# Patient Record
Sex: Male | Born: 1985 | Race: White | Hispanic: No | Smoking: Current some day smoker
Health system: Southern US, Community
[De-identification: ages and names within clinical notes are randomized; demographics above are authoritative.]

## PROBLEM LIST (undated history)

## (undated) DIAGNOSIS — S069XAA Unspecified intracranial injury with loss of consciousness status unknown, initial encounter: Secondary | ICD-10-CM

## (undated) DIAGNOSIS — S069X9A Unspecified intracranial injury with loss of consciousness of unspecified duration, initial encounter: Secondary | ICD-10-CM

## (undated) DIAGNOSIS — S3992XA Unspecified injury of lower back, initial encounter: Secondary | ICD-10-CM

---
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
## 2008-06-28 ENCOUNTER — Emergency Department (HOSPITAL_COMMUNITY): Admission: EM | Admit: 2008-06-28 | Discharge: 2008-06-28 | Payer: Self-pay | Admitting: Emergency Medicine

## 2009-08-22 ENCOUNTER — Emergency Department (HOSPITAL_COMMUNITY): Admission: EM | Admit: 2009-08-22 | Discharge: 2009-08-22 | Payer: Self-pay | Admitting: Emergency Medicine

## 2009-09-08 ENCOUNTER — Emergency Department (HOSPITAL_COMMUNITY): Admission: EM | Admit: 2009-09-08 | Discharge: 2009-09-09 | Payer: Self-pay | Admitting: Emergency Medicine

## 2010-12-21 ENCOUNTER — Emergency Department (HOSPITAL_COMMUNITY)
Admission: EM | Admit: 2010-12-21 | Discharge: 2010-12-21 | Disposition: A | Discharge status: Home / Self Care | Payer: Self-pay | Attending: Emergency Medicine | Admitting: Emergency Medicine

## 2010-12-21 DIAGNOSIS — R51 Headache: Secondary | ICD-10-CM | POA: Insufficient documentation

## 2011-02-07 ENCOUNTER — Emergency Department (HOSPITAL_COMMUNITY)
Admission: EM | Admit: 2011-02-07 | Discharge: 2011-02-07 | Disposition: A | Discharge status: Home / Self Care | Payer: Self-pay | Attending: Emergency Medicine | Admitting: Emergency Medicine

## 2011-02-07 DIAGNOSIS — Z87820 Personal history of traumatic brain injury: Secondary | ICD-10-CM | POA: Insufficient documentation

## 2011-02-07 DIAGNOSIS — M5430 Sciatica, unspecified side: Secondary | ICD-10-CM | POA: Insufficient documentation

## 2011-03-20 ENCOUNTER — Emergency Department (HOSPITAL_COMMUNITY)
Admission: EM | Admit: 2011-03-20 | Discharge: 2011-03-20 | Disposition: A | Discharge status: Home / Self Care | Payer: Self-pay | Attending: Emergency Medicine | Admitting: Emergency Medicine

## 2011-03-20 DIAGNOSIS — M545 Low back pain, unspecified: Secondary | ICD-10-CM | POA: Insufficient documentation

## 2011-08-12 ENCOUNTER — Inpatient Hospital Stay (INDEPENDENT_AMBULATORY_CARE_PROVIDER_SITE_OTHER)
Admission: RE | Admit: 2011-08-12 | Discharge: 2011-08-12 | Disposition: A | Discharge status: Home / Self Care | Payer: Self-pay | Source: Ambulatory Visit | Attending: Emergency Medicine | Admitting: Emergency Medicine

## 2011-08-12 DIAGNOSIS — S335XXA Sprain of ligaments of lumbar spine, initial encounter: Secondary | ICD-10-CM

## 2011-11-04 ENCOUNTER — Emergency Department (HOSPITAL_COMMUNITY)
Admission: EM | Admit: 2011-11-04 | Discharge: 2011-11-04 | Disposition: A | Discharge status: Home / Self Care | Payer: Self-pay | Source: Home / Self Care | Attending: Family Medicine | Admitting: Family Medicine

## 2011-11-04 DIAGNOSIS — M62838 Other muscle spasm: Secondary | ICD-10-CM

## 2011-11-04 DIAGNOSIS — M545 Low back pain: Secondary | ICD-10-CM

## 2011-11-04 HISTORY — DX: Unspecified injury of lower back, initial encounter: S39.92XA

## 2011-11-04 MED ORDER — TRAMADOL HCL 50 MG PO TABS: 50.0000 mg | ORAL_TABLET | Freq: Three times a day (TID) | ORAL | Status: AC | PRN

## 2011-11-04 MED ORDER — CYCLOBENZAPRINE HCL 10 MG PO TABS: 10.0000 mg | ORAL_TABLET | Freq: Two times a day (BID) | ORAL | Status: AC | PRN

## 2011-11-04 MED ORDER — IBUPROFEN 800 MG PO TABS: 800.0000 mg | ORAL_TABLET | Freq: Three times a day (TID) | ORAL | Status: AC
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
## 2014-03-08 ENCOUNTER — Emergency Department (HOSPITAL_COMMUNITY)
Admission: EM | Admit: 2014-03-08 | Discharge: 2014-03-08 | Disposition: A | Discharge status: Home / Self Care | Payer: Non-veteran care | Attending: Emergency Medicine | Admitting: Emergency Medicine

## 2014-03-08 ENCOUNTER — Encounter (HOSPITAL_COMMUNITY): Payer: Self-pay | Admitting: Emergency Medicine

## 2014-03-08 DIAGNOSIS — M545 Low back pain, unspecified: Secondary | ICD-10-CM | POA: Insufficient documentation

## 2014-03-08 DIAGNOSIS — Z87828 Personal history of other (healed) physical injury and trauma: Secondary | ICD-10-CM | POA: Insufficient documentation

## 2014-03-08 DIAGNOSIS — Z87820 Personal history of traumatic brain injury: Secondary | ICD-10-CM | POA: Insufficient documentation

## 2014-03-08 DIAGNOSIS — R143 Flatulence: Secondary | ICD-10-CM

## 2014-03-08 DIAGNOSIS — F17200 Nicotine dependence, unspecified, uncomplicated: Secondary | ICD-10-CM | POA: Insufficient documentation

## 2014-03-08 DIAGNOSIS — R209 Unspecified disturbances of skin sensation: Secondary | ICD-10-CM | POA: Insufficient documentation

## 2014-03-08 DIAGNOSIS — R141 Gas pain: Secondary | ICD-10-CM | POA: Insufficient documentation

## 2014-03-08 DIAGNOSIS — R142 Eructation: Secondary | ICD-10-CM

## 2014-03-08 HISTORY — DX: Unspecified intracranial injury with loss of consciousness status unknown, initial encounter: S06.9XAA

## 2014-03-08 HISTORY — DX: Unspecified intracranial injury with loss of consciousness of unspecified duration, initial encounter: S06.9X9A

## 2014-03-08 MED ORDER — OXYCODONE-ACETAMINOPHEN 5-325 MG PO TABS: 2.0000 | ORAL_TABLET | ORAL | Status: DC | PRN

## 2014-03-08 MED ORDER — IBUPROFEN 800 MG PO TABS: 800.0000 mg | ORAL_TABLET | Freq: Three times a day (TID) | ORAL | Status: DC

## 2014-03-08 MED ORDER — CYCLOBENZAPRINE HCL 10 MG PO TABS: 10.0000 mg | ORAL_TABLET | Freq: Two times a day (BID) | ORAL | Status: DC | PRN

## 2014-03-08 MED ORDER — HYDROMORPHONE HCL PF 1 MG/ML IJ SOLN
1.0000 mg | Freq: Once | INTRAMUSCULAR | Status: AC
  Administered 2014-03-08: 1 mg via INTRAMUSCULAR
  Filled 2014-03-08: qty 1

## 2014-03-08 NOTE — ED Provider Notes (Signed)
CSN: [id number]     Arrival date & time 03/08/14  0547 History   First MD Initiated Contact with Patient 03/08/14 0600     Chief Complaint  Patient presents with  . Back Pain     (Consider location/radiation/quality/duration/timing/severity/associated sxs/prior Treatment) HPI HX per PT - Has intermittent LBP for the last few years followed by PCP at he [facility].  He went to work last night with LBP that was MOD to severe and worse this am. Pain radiates to both legs, had some numbness in his toes but not right now. No weakness. He has had these symptoms in the past.  He had MRI 2 weeks ago but has not got the results yet. He scheduled for follow up. No incontinence. No F/C. 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 No new symptoms. Had motrin PTA Past Medical History  Diagnosis Date  . Back injury   . Traumatic brain injury    History reviewed. No pertinent past surgical history. History reviewed. No pertinent family history. History  Substance Use Topics  . Smoking status: Current Every Day Smoker -- 0.25 packs/day  . Smokeless tobacco: Not on file  . Alcohol Use: No    Review of Systems  Constitutional: Negative for fever and chills.  Respiratory: Negative for shortness of breath.   Cardiovascular: Negative for chest pain.  Gastrointestinal: Negative for vomiting and abdominal pain.  Genitourinary: Negative for dysuria.  Musculoskeletal: Positive for back pain. Negative for neck pain.  Skin: Negative for rash.  Neurological: Negative for headaches.  All other systems reviewed and are negative.     Allergies  Review of patient's allergies indicates no known allergies.  Home Medications   Prior to Admission medications   Not on File   BP 147/100  Pulse 115  Temp(Src) 98.2 F (36.8 C) (Oral)  Resp 16  Ht 5\' 10"  (1.778 m)  Wt 187 lb (84.823 kg)  BMI 26.83 kg/m2  SpO2 100% Physical Exam  Constitutional: He is oriented to  person, place, and time. He appears well-developed and well-nourished.  HENT:  Head: Normocephalic and atraumatic.  Eyes: EOM are normal. Pupils are equal, round, and reactive to light.  Neck: Neck supple.  Cardiovascular: Regular rhythm and intact distal pulses.   Pulmonary/Chest: Effort normal. No respiratory distress.  Abdominal: Soft. Bowel sounds are normal. He exhibits distension.  Musculoskeletal: Normal range of motion. He exhibits no edema.  TTP over lower lumbar spine and paralumbar spine. No midline deformity, no thoracic tenderness. Gait intact. Equal DTRs, distal motor and sensorium to light touch intact/ equal.   Neurological: He is alert and oriented to person, place, and time.  Skin: Skin is warm and dry.    ED Course  Procedures (including critical care time) Labs Review Labs Reviewed - No data to display  Imaging Review No results found.  IM Dilaudid  Pain improved. Plan d/c home to f/u PCP as scheduled for recheck and f/u MRI results. Back pain precautions provided.   MDM   Dx: LBP  IM Narcotics and ice for pain No indication for repeat MRI today VS and nurses notes reviewed/ considered   Sunnie NielsenBrian Kerina Simoneau, MD 03/08/14 0630

## 2014-03-08 NOTE — Discharge Instructions (Signed)
Back Pain, Adult Low back pain is very common. About 1 in 5 people have back pain.The cause of low back pain is rarely dangerous. The pain often gets better over time.About half of people with a sudden onset of back pain feel better in just 2 weeks. About 8 in 10 people feel better by 6 weeks.  CAUSES Some common causes of back pain include:  Strain of the muscles or ligaments supporting the spine.  Wear and tear (degeneration) of the spinal discs.  Arthritis.  Direct injury to the back. DIAGNOSIS Most of the time, the direct cause of low back pain is not known.However, back pain can be treated effectively even when the exact cause of the pain is unknown.Answering your caregiver's questions about your overall health and symptoms is one of the most accurate ways to make sure the cause of your pain is not dangerous. If your caregiver needs more information, he or she may order lab work or imaging tests (X-rays or MRIs).However, even if imaging tests show changes in your back, this usually does not require surgery. HOME CARE INSTRUCTIONS For many people, back pain returns.Since low back pain is rarely dangerous, it is often a condition that people can learn to manageon their own.   Remain active. It is stressful on the back to sit or stand in one place. Do not sit, drive, or stand in one place for more than 30 minutes at a time. Take short walks on level surfaces as soon as pain allows.Try to increase the length of time you walk each day.  Do not stay in bed.Resting more than 1 or 2 days can delay your recovery.  Do not avoid exercise or work.Your body is made to move.It is not dangerous to be active, even though your back may hurt.Your back will likely heal faster if you return to being active before your pain is gone.  Pay attention to your body when you bend and lift. Many people have less discomfortwhen lifting if they bend their knees, keep the load close to their bodies,and  avoid twisting. Often, the most comfortable positions are those that put less stress on your recovering back.  Find a comfortable position to sleep. Use a firm mattress and lie on your side with your knees slightly bent. If you lie on your back, put a pillow under your knees.  Only take over-the-counter or prescription medicines as directed by your caregiver. Over-the-counter medicines to reduce pain and inflammation are often the most helpful.Your caregiver may prescribe muscle relaxant drugs.These medicines help dull your pain so you can more quickly return to your normal activities and healthy exercise.  Put ice on the injured area.  Put ice in a plastic bag.  Place a towel between your skin and the bag.  Leave the ice on for 15-20 minutes, 03-04 times a day for the first 2 to 3 days. After that, ice and heat may be alternated to reduce pain and spasms.  Ask your caregiver about trying back exercises and gentle massage. This may be of some benefit.  Avoid feeling anxious or stressed.Stress increases muscle tension and can worsen back pain.It is important to recognize when you are anxious or stressed and learn ways to manage it.Exercise is a great option. SEEK MEDICAL CARE IF:  You have pain that is not relieved with rest or medicine.  You have pain that does not improve in 1 week.  You have new symptoms.  You are generally not feeling well. SEEK   IMMEDIATE MEDICAL CARE IF:   You have pain that radiates from your back into your legs.  You develop new bowel or bladder control problems.  You have unusual weakness or numbness in your arms or legs.  You develop nausea or vomiting.  You develop abdominal pain.  You feel faint. Document Released: 10/17/2005 Document Revised: 04/17/2012 Document Reviewed: 03/07/2011 ExitCare Patient Information 2014 [employer].  

## 2014-03-08 NOTE — ED Notes (Signed)
History of back injury in 2010 but has not had flare up until tonight at work.  Works as Optometristbouncer.  Pain is mid back mostly on R w/radiation down back of legs bilaterally.

## 2014-07-07 ENCOUNTER — Encounter (HOSPITAL_COMMUNITY): Payer: Self-pay | Admitting: Emergency Medicine

## 2014-07-07 ENCOUNTER — Emergency Department (HOSPITAL_COMMUNITY)
Admission: EM | Admit: 2014-07-07 | Discharge: 2014-07-07 | Disposition: A | Discharge status: Home / Self Care | Payer: Non-veteran care | Attending: Emergency Medicine | Admitting: Emergency Medicine

## 2014-07-07 DIAGNOSIS — Z87820 Personal history of traumatic brain injury: Secondary | ICD-10-CM | POA: Diagnosis not present

## 2014-07-07 DIAGNOSIS — Z87828 Personal history of other (healed) physical injury and trauma: Secondary | ICD-10-CM | POA: Insufficient documentation

## 2014-07-07 DIAGNOSIS — F17200 Nicotine dependence, unspecified, uncomplicated: Secondary | ICD-10-CM | POA: Insufficient documentation

## 2014-07-07 DIAGNOSIS — M545 Low back pain, unspecified: Secondary | ICD-10-CM | POA: Insufficient documentation

## 2014-07-07 DIAGNOSIS — M5441 Lumbago with sciatica, right side: Secondary | ICD-10-CM

## 2014-07-07 DIAGNOSIS — Z791 Long term (current) use of non-steroidal anti-inflammatories (NSAID): Secondary | ICD-10-CM | POA: Insufficient documentation

## 2014-07-07 DIAGNOSIS — M5430 Sciatica, unspecified side: Secondary | ICD-10-CM | POA: Diagnosis not present

## 2014-07-07 MED ORDER — ACETAMINOPHEN-CODEINE #3 300-30 MG PO TABS: 1.0000 | ORAL_TABLET | Freq: Four times a day (QID) | ORAL | Status: DC | PRN

## 2014-07-07 MED ORDER — METHOCARBAMOL 500 MG PO TABS
1000.0000 mg | ORAL_TABLET | Freq: Once | ORAL | Status: AC
Start: 2014-07-07 — End: 2014-07-07
  Administered 2014-07-07: 1000 mg via ORAL
  Filled 2014-07-07: qty 2

## 2014-07-07 MED ORDER — IBUPROFEN 800 MG PO TABS
800.0000 mg | ORAL_TABLET | Freq: Once | ORAL | Status: AC
  Administered 2014-07-07: 800 mg via ORAL
  Filled 2014-07-07: qty 1

## 2014-07-07 MED ORDER — ACETAMINOPHEN-CODEINE #3 300-30 MG PO TABS
2.0000 | ORAL_TABLET | Freq: Once | ORAL | Status: AC
  Administered 2014-07-07: 2 via ORAL
  Filled 2014-07-07: qty 2
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
## 2014-07-07 NOTE — Discharge Instructions (Signed)
Back Pain, Adult °Back pain is very common. The pain often gets better over time. The cause of back pain is usually not dangerous. Most people can learn to manage their back pain on their own.  °HOME CARE  °· Stay active. Start with short walks on flat ground if you can. Try to walk farther each day. °· Do not sit, drive, or stand in one place for more than 30 minutes. Do not stay in bed. °· Do not avoid exercise or work. Activity can help your back heal faster. °· Be careful when you bend or lift an object. Bend at your knees, keep the object close to you, and do not twist. °· Sleep on a firm mattress. Lie on your side, and bend your knees. If you lie on your back, put a pillow under your knees. °· Only take medicines as told by your doctor. °· Put ice on the injured area. °¨ Put ice in a plastic bag. °¨ Place a towel between your skin and the bag. °¨ Leave the ice on for 15-20 minutes, 03-04 times a day for the first 2 to 3 days. After that, you can switch between ice and heat packs. °· Ask your doctor about back exercises or massage. °· Avoid feeling anxious or stressed. Find good ways to deal with stress, such as exercise. °GET HELP RIGHT AWAY IF:  °· Your pain does not go away with rest or medicine. °· Your pain does not go away in 1 week. °· You have new problems. °· You do not feel well. °· The pain spreads into your legs. °· You cannot control when you poop (bowel movement) or pee (urinate). °· Your arms or legs feel weak or lose feeling (numbness). °· You feel sick to your stomach (nauseous) or throw up (vomit). °· You have belly (abdominal) pain. °· You feel like you may pass out (faint). °MAKE SURE YOU:  °· Understand these instructions. °· Will watch your condition. °· Will get help right away if you are not doing well or get worse. °Document Released: 04/04/2008 Document Revised: 01/09/2012 Document Reviewed: 02/18/2014 °ExitCare® Patient Information ©2015 [employer]. This information is not intended  to replace advice given to you by your health care provider. Make sure you discuss any questions you have with your health care provider. ° °
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
## 2014-07-07 NOTE — ED Provider Notes (Signed)
Medical screening examination/treatment/procedure(s) were performed by non-physician practitioner and as supervising physician I was immediately available for consultation/collaboration.    Waleska Buttery, MD 07/07/14 2239 
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
## 2014-08-26 ENCOUNTER — Emergency Department (HOSPITAL_COMMUNITY)
Admission: EM | Admit: 2014-08-26 | Discharge: 2014-08-26 | Disposition: A | Discharge status: Home / Self Care | Payer: Non-veteran care | Attending: Emergency Medicine | Admitting: Emergency Medicine

## 2014-08-26 ENCOUNTER — Encounter (HOSPITAL_COMMUNITY): Payer: Self-pay | Admitting: Emergency Medicine

## 2014-08-26 ENCOUNTER — Emergency Department (HOSPITAL_COMMUNITY): Payer: Non-veteran care

## 2014-08-26 DIAGNOSIS — R200 Anesthesia of skin: Secondary | ICD-10-CM | POA: Diagnosis not present

## 2014-08-26 DIAGNOSIS — Z720 Tobacco use: Secondary | ICD-10-CM | POA: Diagnosis not present

## 2014-08-26 DIAGNOSIS — R61 Generalized hyperhidrosis: Secondary | ICD-10-CM | POA: Diagnosis not present

## 2014-08-26 DIAGNOSIS — R0602 Shortness of breath: Secondary | ICD-10-CM | POA: Diagnosis not present

## 2014-08-26 DIAGNOSIS — R079 Chest pain, unspecified: Secondary | ICD-10-CM | POA: Diagnosis not present

## 2014-08-26 DIAGNOSIS — Z7982 Long term (current) use of aspirin: Secondary | ICD-10-CM | POA: Diagnosis not present

## 2014-08-26 DIAGNOSIS — Z87828 Personal history of other (healed) physical injury and trauma: Secondary | ICD-10-CM | POA: Diagnosis not present

## 2014-08-26 DIAGNOSIS — Z87820 Personal history of traumatic brain injury: Secondary | ICD-10-CM | POA: Insufficient documentation

## 2014-08-26 LAB — COMPREHENSIVE METABOLIC PANEL
ALT: 14 U/L (ref 0–53)
AST: 18 U/L (ref 0–37)
Albumin: 4.3 g/dL (ref 3.5–5.2)
Alkaline Phosphatase: 67 U/L (ref 39–117)
Anion gap: 9 (ref 5–15)
BUN: 12 mg/dL (ref 6–23)
CO2: 31 mEq/L (ref 19–32)
Calcium: 9.4 mg/dL (ref 8.4–10.5)
Chloride: 99 mEq/L (ref 96–112)
Creatinine, Ser: 0.94 mg/dL (ref 0.50–1.35)
GFR calc Af Amer: >90 mL/min (ref >90)
GFR calc non Af Amer: >90 mL/min (ref >90)
Glucose, Bld: 97 mg/dL (ref 70–99)
Potassium: 4.5 mEq/L (ref 3.7–5.3)
Sodium: 139 mEq/L (ref 137–147)
Total Bilirubin: 0.2 mg/dL — ABNORMAL LOW (ref 0.3–1.2)
Total Protein: 7.7 g/dL (ref 6.0–8.3)

## 2014-08-26 LAB — CBC WITH DIFFERENTIAL/PLATELET
Basophils Absolute: 0 10*3/uL (ref 0.0–0.1)
Basophils Relative: 0 % (ref 0–1)
Eosinophils Absolute: 0.4 10*3/uL (ref 0.0–0.7)
Eosinophils Relative: 3 % (ref 0–5)
HCT: 46.4 % (ref 39.0–52.0)
Hemoglobin: 15.9 g/dL (ref 13.0–17.0)
Lymphocytes Relative: 25 % (ref 12–46)
Lymphs Abs: 3 10*3/uL (ref 0.7–4.0)
MCH: 30.8 pg (ref 26.0–34.0)
MCHC: 34.3 g/dL (ref 30.0–36.0)
MCV: 89.7 fL (ref 78.0–100.0)
Monocytes Absolute: 0.5 10*3/uL (ref 0.1–1.0)
Monocytes Relative: 4 % (ref 3–12)
Neutro Abs: 8.3 10*3/uL — ABNORMAL HIGH (ref 1.7–7.7)
Neutrophils Relative %: 68 % (ref 43–77)
Platelets: 180 10*3/uL (ref 150–400)
RBC: 5.17 MIL/uL (ref 4.22–5.81)
RDW: 12.8 % (ref 11.5–15.5)
WBC: 12.2 10*3/uL — ABNORMAL HIGH (ref 4.0–10.5)

## 2014-08-26 LAB — TROPONIN I: Troponin I: <0.3 ng/mL (ref <0.30)

## 2014-08-26 NOTE — ED Notes (Signed)
Patient c/o left side chest pain that radiates down left arm. Per patient pain started Thursday but has progressively gotten worse today. Patient reports shortness of breath and "sweating." Patient reports taking 81 mg aspirin today.

## 2014-08-26 NOTE — Discharge Instructions (Signed)

## 2014-08-26 NOTE — ED Provider Notes (Signed)
CSN: [id number]     Arrival date & time 08/26/14  1507 History  This chart was scribed for Roger RazorStephen Primus Gritton, MD by Bronson CurbJacqueline Melvin, ED Scribe. This patient was seen in room [facility] and the patient's care was started at 8:47 PM.    Chief Complaint  Patient presents with  . Chest Pain    The history is provided by the patient and a parent. No language interpreter was used.    HPI Comments: Roger CrewsJason A Cox is a 28 y.o. male who presents to the Emergency Department complaining of sudden onset left sided chest pain that began 10 hours ago. Patient states he was asymptomatic when he woke up this morning. He denies any physical exertion prior to onset and states he was lying the couch when the pain began. There is associated chest tightness, SOB, diaphoresis, and numbness/tinging to the left arm. He denies history of prior episodes. Patient reports relief while at rest, but denies any aggravating factors. He denies any leg pain or swelling. Patient states his symptoms have since resolved, and feels he has returned to baseline, but notes some lingering back pain. He is a current occasional smoker, with a family history of MI on the paternal side. Mother is concerned for the patient's elevated blood pressure (155/111 at 1935), and reports that patient takes a variety of medications, including Flexril and Ambien, and consumes energy drinks in large quantities. Patient receives his primary care through the [facility], and will follow up there regarding his high blood pressure.    Past Medical History  Diagnosis Date  . Back injury   . Traumatic brain injury    History reviewed. No pertinent past surgical history. Family History  Problem Relation Age of Onset  . Heart attack Father   . Congenital heart disease Sister    History  Substance Use Topics  . Smoking status: Current Every Day Smoker -- 0.25 packs/day for 10 years    Types: Cigarettes  . Smokeless tobacco: Never Used  . Alcohol Use: No     Review of Systems  Constitutional: Positive for diaphoresis.  Respiratory: Positive for chest tightness and shortness of breath.   Cardiovascular: Positive for chest pain. Negative for leg swelling.  Neurological: Positive for numbness.  All other systems reviewed and are negative.     Allergies  Review of patient's allergies indicates no known allergies.  Home Medications   Prior to Admission medications   Medication Sig Start Date End Date Taking? Authorizing Provider  aspirin EC 81 MG tablet Take 324 mg by mouth once as needed (for chest pain).   Yes Historical Provider, MD  cyclobenzaprine (FLEXERIL) 10 MG tablet Take 10 mg by mouth 3 (three) times daily as needed for muscle spasms.   Yes Historical Provider, MD  zolpidem (AMBIEN) 5 MG tablet Take 5 mg by mouth at bedtime as needed for sleep.   Yes Historical Provider, MD   Triage Vitals: BP 155/111  Pulse 78  Temp(Src) 99.1 F (37.3 C) (Oral)  Resp 14  Ht 5\' 11"  (1.803 m)  Wt 185 lb (83.915 kg)  BMI 25.81 kg/m2  SpO2 98%  Physical Exam  Nursing note and vitals reviewed. Constitutional: He is oriented to person, place, and time. He appears well-developed and well-nourished. No distress.  HENT:  Head: Normocephalic and atraumatic.  Eyes: Conjunctivae and EOM are normal.  Neck: Neck supple. No tracheal deviation present.  Cardiovascular: Normal rate, regular rhythm and normal heart sounds.   Pulmonary/Chest: Effort normal and  breath sounds normal. No respiratory distress.  Musculoskeletal: Normal range of motion.  Neurological: He is alert and oriented to person, place, and time.  Skin: Skin is warm and dry.  Psychiatric: He has a normal mood and affect. His behavior is normal.    ED Course  Procedures (including critical care time)  DIAGNOSTIC STUDIES: Oxygen Saturation is 98% on room air, normal by my interpretation.    COORDINATION OF CARE: At 2054 Discussed treatment plan with patient which includes  review of EKG and lab work. Patient agrees.   Labs Review Labs Reviewed  CBC WITH DIFFERENTIAL - Abnormal; Notable for the following:    WBC 12.2 (*)    Neutro Abs 8.3 (*)    All other components within normal limits  COMPREHENSIVE METABOLIC PANEL - Abnormal; Notable for the following:    Total Bilirubin 0.2 (*)    All other components within normal limits  TROPONIN I    Imaging Review Dg Chest 2 View  08/26/2014   CLINICAL DATA:  Chest pain.  EXAM: CHEST  2 VIEW  COMPARISON:  None.  FINDINGS: The heart size and mediastinal contours are within normal limits. Both lungs are clear. No pneumothorax or pleural effusion is noted. The visualized skeletal structures are unremarkable.  IMPRESSION: No acute cardiopulmonary abnormality seen.   Electronically Signed   By: Roque LiasJames  Cox M.D.   On: 08/26/2014 16:10     EKG Interpretation   Date/Time:  Tuesday August 26 2014 15:24:08 EDT Ventricular Rate:  91 PR Interval:  122 QRS Duration: 92 QT Interval:  364 QTC Calculation: 447 R Axis:   27 Text Interpretation:  Normal sinus rhythm Normal ECG Confirmed by Roger Demuro   MD, Roger Cox ([id number]) on 08/26/2014 8:05:10 PM      MDM   Final diagnoses:  Chest pain, unspecified chest pain type    28 year old male with chest pain. Atypical for ACS. Doubt pulmonary embolism. Dissection. Chest x-ray is clear. EKG without acute appearing changes. Very low suspicion for emergent process. Return precautions were discussed. Discussed elevated blood pressure in the emergency room. He has no diagnosed history of this. He needs follow-up this PCP.   I personally preformed the services scribed in my presence. The recorded information has been reviewed is accurate. Roger RazorStephen Alexiana Laverdure, MD.    Roger RazorStephen Roger Boeh, MD 09/04/14 1050

## 2015-05-08 ENCOUNTER — Emergency Department (HOSPITAL_COMMUNITY): Payer: Non-veteran care

## 2015-05-08 ENCOUNTER — Encounter (HOSPITAL_COMMUNITY): Payer: Self-pay | Admitting: *Deleted

## 2015-05-08 ENCOUNTER — Emergency Department (HOSPITAL_COMMUNITY)
Admission: EM | Admit: 2015-05-08 | Discharge: 2015-05-08 | Disposition: A | Discharge status: Home / Self Care | Payer: Non-veteran care | Attending: Emergency Medicine | Admitting: Emergency Medicine

## 2015-05-08 DIAGNOSIS — S0990XA Unspecified injury of head, initial encounter: Secondary | ICD-10-CM | POA: Diagnosis present

## 2015-05-08 DIAGNOSIS — Z720 Tobacco use: Secondary | ICD-10-CM | POA: Insufficient documentation

## 2015-05-08 DIAGNOSIS — Z7982 Long term (current) use of aspirin: Secondary | ICD-10-CM | POA: Insufficient documentation

## 2015-05-08 DIAGNOSIS — W01198A Fall on same level from slipping, tripping and stumbling with subsequent striking against other object, initial encounter: Secondary | ICD-10-CM | POA: Insufficient documentation

## 2015-05-08 DIAGNOSIS — S39012A Strain of muscle, fascia and tendon of lower back, initial encounter: Secondary | ICD-10-CM | POA: Diagnosis not present

## 2015-05-08 DIAGNOSIS — Z87820 Personal history of traumatic brain injury: Secondary | ICD-10-CM | POA: Diagnosis not present

## 2015-05-08 DIAGNOSIS — S29012A Strain of muscle and tendon of back wall of thorax, initial encounter: Secondary | ICD-10-CM | POA: Insufficient documentation

## 2015-05-08 DIAGNOSIS — Z87828 Personal history of other (healed) physical injury and trauma: Secondary | ICD-10-CM | POA: Diagnosis not present

## 2015-05-08 DIAGNOSIS — S161XXA Strain of muscle, fascia and tendon at neck level, initial encounter: Secondary | ICD-10-CM | POA: Insufficient documentation

## 2015-05-08 DIAGNOSIS — R52 Pain, unspecified: Secondary | ICD-10-CM

## 2015-05-08 DIAGNOSIS — Y92008 Other place in unspecified non-institutional (private) residence as the place of occurrence of the external cause: Secondary | ICD-10-CM | POA: Diagnosis not present

## 2015-05-08 DIAGNOSIS — S060X1A Concussion with loss of consciousness of 30 minutes or less, initial encounter: Secondary | ICD-10-CM | POA: Diagnosis not present

## 2015-05-08 DIAGNOSIS — Y9389 Activity, other specified: Secondary | ICD-10-CM | POA: Diagnosis not present

## 2015-05-08 DIAGNOSIS — Y998 Other external cause status: Secondary | ICD-10-CM | POA: Insufficient documentation

## 2015-05-08 MED ORDER — FENTANYL CITRATE (PF) 100 MCG/2ML IJ SOLN
100.0000 ug | Freq: Once | INTRAMUSCULAR | Status: AC
  Administered 2015-05-08: 100 ug via INTRAVENOUS
  Filled 2015-05-08: qty 2

## 2015-05-08 MED ORDER — KETOROLAC TROMETHAMINE 30 MG/ML IJ SOLN
30.0000 mg | Freq: Once | INTRAMUSCULAR | Status: AC
  Administered 2015-05-08: 30 mg via INTRAVENOUS
  Filled 2015-05-08: qty 1

## 2015-05-08 MED ORDER — MORPHINE SULFATE 4 MG/ML IJ SOLN
4.0000 mg | Freq: Once | INTRAMUSCULAR | Status: AC
  Administered 2015-05-08: 4 mg via INTRAVENOUS
  Filled 2015-05-08: qty 1
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
## 2015-05-08 NOTE — ED Notes (Signed)
Pt able to ambulate around nursing desk with no problems noted,

## 2015-05-08 NOTE — ED Notes (Signed)
Dr Wickline at bedside,  

## 2015-05-08 NOTE — Discharge Instructions (Signed)
You have had a head injury which does not appear to require admission at this time. A concussion is a state of changed mental ability from trauma.  SEEK IMMEDIATE MEDICAL ATTENTION IF: There is confusion or drowsiness (although children frequently become drowsy after injury).  You cannot awaken the injured person.  There is nausea (feeling sick to your stomach) or continued, forceful vomiting.  You notice dizziness or unsteadiness which is getting worse, or inability to walk.  You have convulsions or unconsciousness.  You experience severe, persistent headaches not relieved by Tylenol. (Do not take aspirin as this impairs clotting abilities). Take other pain medications only as directed.  You cannot use arms or legs normally.  There are changes in pupil sizes. (This is the black center in the colored part of the eye)  There is clear or bloody discharge from the nose or ears.  Change in speech, vision, swallowing, or understanding.  Localized weakness, numbness, tingling, or change in bowel or bladder control.  You have neck pain, possibly from a cervical strain and/or pinched nerve.   SEEK IMMEDIATE MEDICAL ATTENTION IF: You develop difficulties swallowing or breathing.  You have new or worse numbness, weakness, tingling, or movement problems in your arms or legs.  You develop increasing pain which is uncontrolled with medications.  You have change in bowel or bladder function, or other concerns.   SEEK IMMEDIATE MEDICAL ATTENTION IF: New numbness, tingling, weakness, or problem with the use of your arms or legs.  Severe back pain not relieved with medications.  Change in bowel or bladder control (if you lose control of stool or urine, or if you are unable to urinate) Increasing pain in any areas of the body (such as chest or abdominal pain).  Shortness of breath, dizziness or fainting.  Nausea (feeling sick to your stomach), vomiting, fever, or sweats.

## 2015-05-08 NOTE — ED Notes (Signed)
Dr. Wickline at bedside.  

## 2015-05-08 NOTE — ED Notes (Signed)
c-collar removed, 

## 2015-05-08 NOTE — ED Notes (Signed)
Pt was on scoop stretcher which was removed by ems, cms intact pre and post removal of scoop stretcher,

## 2015-05-08 NOTE — ED Notes (Signed)
Pt arrived to er with c/o fall, ? LOC, ems reports that pt fell at least 6 feet down steps, landing on neck and back area, c/o pain to same areas, pt states that he had walked outside, slipped on wet steps, pt arrived to er with c-collar in place, alert, able to answer questions, cms intact all extremities,

## 2015-05-08 NOTE — ED Notes (Signed)
Pt requesting additional iv pain medication, reminded pt that he had just gotten iv pain medication, pt states that he did not realize that he had "that much medication in him", Dr Bebe ShaggyWickline notified,

## 2016-02-15 ENCOUNTER — Emergency Department (HOSPITAL_COMMUNITY)
Admission: EM | Admit: 2016-02-15 | Discharge: 2016-02-15 | Disposition: A | Discharge status: Home / Self Care | Payer: Non-veteran care | Attending: Emergency Medicine | Admitting: Emergency Medicine

## 2016-02-15 ENCOUNTER — Encounter (HOSPITAL_COMMUNITY): Payer: Self-pay

## 2016-02-15 DIAGNOSIS — M545 Low back pain, unspecified: Secondary | ICD-10-CM

## 2016-02-15 DIAGNOSIS — Z9889 Other specified postprocedural states: Secondary | ICD-10-CM | POA: Diagnosis not present

## 2016-02-15 DIAGNOSIS — G8929 Other chronic pain: Secondary | ICD-10-CM | POA: Insufficient documentation

## 2016-02-15 DIAGNOSIS — Z87820 Personal history of traumatic brain injury: Secondary | ICD-10-CM | POA: Diagnosis not present

## 2016-02-15 DIAGNOSIS — F17210 Nicotine dependence, cigarettes, uncomplicated: Secondary | ICD-10-CM | POA: Insufficient documentation

## 2016-02-15 DIAGNOSIS — Z7982 Long term (current) use of aspirin: Secondary | ICD-10-CM | POA: Insufficient documentation

## 2016-02-15 MED ORDER — DIAZEPAM 10 MG PO TABS: 10.0000 mg | ORAL_TABLET | Freq: Four times a day (QID) | ORAL | Status: DC | PRN

## 2016-02-15 MED ORDER — HYDROCODONE-ACETAMINOPHEN 5-325 MG PO TABS: 1.0000 | ORAL_TABLET | ORAL | Status: DC | PRN

## 2016-02-15 NOTE — ED Notes (Signed)
Dr Effie ShyWentz at bedside. Patient does not want steroids the girlfriend does and is requesting. Patient ready for discharge.

## 2016-02-15 NOTE — ED Notes (Addendum)
Pt c/o BLE weakness starting this afternoon and chronic R lower back pain.  Pain score 9/10.  Pt reports taking ibuprofen w/o relief.  Pt reports "[place].  About 30 minutes ago (1430) my legs just gave out and now, they are tingly."  Pt was bending over at a water fountain when his legs "gave out."  Further, Pt reports helping his fiancee move recently.

## 2016-02-15 NOTE — ED Notes (Signed)
Pt request something for pain prior to discharge; Effie ShyWentz made aware face to face.

## 2016-02-15 NOTE — Discharge Instructions (Signed)

## 2016-02-15 NOTE — ED Notes (Addendum)
Wentz at bedside. 

## 2016-02-15 NOTE — ED Notes (Addendum)
Patient family member demanding a steroid shot be administered. Dr Effie ShyWentz informed. Reports that he would be in to see patient. Patient states that he is not leaving until he speaks with someone.
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
## 2016-02-15 NOTE — ED Notes (Addendum)
Pt reports waiting at water fountain with cane and feet became numb; denies dizziness or fall; reports only tingling in feet at present time. Pt continues to reports hx of same; "happens every 3 to 4 months."

## 2016-04-22 ENCOUNTER — Encounter (HOSPITAL_COMMUNITY): Payer: Self-pay | Admitting: Emergency Medicine

## 2016-04-22 ENCOUNTER — Emergency Department (HOSPITAL_COMMUNITY): Payer: Medicaid Other

## 2016-04-22 ENCOUNTER — Emergency Department (HOSPITAL_COMMUNITY)
Admission: EM | Admit: 2016-04-22 | Discharge: 2016-04-23 | Disposition: A | Discharge status: Home / Self Care | Payer: Medicaid Other | Attending: Emergency Medicine | Admitting: Emergency Medicine

## 2016-04-22 DIAGNOSIS — F17210 Nicotine dependence, cigarettes, uncomplicated: Secondary | ICD-10-CM | POA: Insufficient documentation

## 2016-04-22 DIAGNOSIS — M545 Low back pain: Secondary | ICD-10-CM | POA: Diagnosis not present

## 2016-04-22 DIAGNOSIS — R1011 Right upper quadrant pain: Secondary | ICD-10-CM | POA: Diagnosis not present

## 2016-04-22 DIAGNOSIS — Z79899 Other long term (current) drug therapy: Secondary | ICD-10-CM | POA: Diagnosis not present

## 2016-04-22 LAB — COMPREHENSIVE METABOLIC PANEL
ALK PHOS: 54 U/L (ref 38–126)
ALT: 71 U/L — ABNORMAL HIGH (ref 17–63)
AST: 50 U/L — AB (ref 15–41)
Albumin: 4.5 g/dL (ref 3.5–5.0)
Anion gap: 7 (ref 5–15)
BUN: 12 mg/dL (ref 6–20)
CHLORIDE: 106 mmol/L (ref 101–111)
CO2: 24 mmol/L (ref 22–32)
Calcium: 9.2 mg/dL (ref 8.9–10.3)
Creatinine, Ser: 0.98 mg/dL (ref 0.61–1.24)
GFR calc Af Amer: >60 mL/min (ref >60)
GFR calc non Af Amer: >60 mL/min (ref >60)
Glucose, Bld: 103 mg/dL — ABNORMAL HIGH (ref 65–99)
Potassium: 4.2 mmol/L (ref 3.5–5.1)
Sodium: 137 mmol/L (ref 135–145)
Total Bilirubin: 0.4 mg/dL (ref 0.3–1.2)
Total Protein: 7.5 g/dL (ref 6.5–8.1)

## 2016-04-22 LAB — CBC
HEMATOCRIT: 49.7 % (ref 39.0–52.0)
HEMOGLOBIN: 16.6 g/dL (ref 13.0–17.0)
MCH: 29.6 pg (ref 26.0–34.0)
MCHC: 33.4 g/dL (ref 30.0–36.0)
MCV: 88.8 fL (ref 78.0–100.0)
Platelets: 155 10*3/uL (ref 150–400)
RBC: 5.6 MIL/uL (ref 4.22–5.81)
RDW: 13.9 % (ref 11.5–15.5)
WBC: 11.1 10*3/uL — AB (ref 4.0–10.5)

## 2016-04-22 LAB — LIPASE, BLOOD: Lipase: 25 U/L (ref 11–51)

## 2016-04-22 MED ORDER — OXYCODONE-ACETAMINOPHEN 5-325 MG PO TABS: 1.0000 | ORAL_TABLET | ORAL | Status: AC | PRN

## 2016-04-22 MED ORDER — HYDROMORPHONE HCL 1 MG/ML IJ SOLN
1.0000 mg | Freq: Once | INTRAMUSCULAR | Status: AC
  Administered 2016-04-22: 1 mg via INTRAVENOUS
  Filled 2016-04-22: qty 1

## 2016-04-22 MED ORDER — ONDANSETRON HCL 4 MG/2ML IJ SOLN
4.0000 mg | Freq: Once | INTRAMUSCULAR | Status: AC
  Administered 2016-04-22: 4 mg via INTRAVENOUS
  Filled 2016-04-22: qty 2

## 2016-04-22 MED ORDER — SODIUM CHLORIDE 0.9 % IV BOLUS (SEPSIS)
1000.0000 mL | Freq: Once | INTRAVENOUS | Status: AC
  Administered 2016-04-22: 1000 mL via INTRAVENOUS

## 2016-04-22 MED ORDER — SODIUM CHLORIDE 0.9 % IV SOLN: INTRAVENOUS | Status: DC

## 2016-04-22 NOTE — Discharge Instructions (Signed)
Abdominal Pain, Adult °Many things can cause abdominal pain. Usually, abdominal pain is not caused by a disease and will improve without treatment. It can often be observed and treated at home. Your health care provider will do a physical exam and possibly order blood tests and X-rays to help determine the seriousness of your pain. However, in many cases, more time must pass before a clear cause of the pain can be found. Before that point, your health care provider may not know if you need more testing or further treatment. °HOME CARE INSTRUCTIONS °Monitor your abdominal pain for any changes. The following actions may help to alleviate any discomfort you are experiencing: °· Only take over-the-counter or prescription medicines as directed by your health care provider. °· Do not take laxatives unless directed to do so by your health care provider. °· Try a clear liquid diet (broth, tea, or water) as directed by your health care provider. Slowly move to a bland diet as tolerated. °SEEK MEDICAL CARE IF: °· You have unexplained abdominal pain. °· You have abdominal pain associated with nausea or diarrhea. °· You have pain when you urinate or have a bowel movement. °· You experience abdominal pain that wakes you in the night. °· You have abdominal pain that is worsened or improved by eating food. °· You have abdominal pain that is worsened with eating fatty foods. °· You have a fever. °SEEK IMMEDIATE MEDICAL CARE IF: °· Your pain does not go away within 2 hours. °· You keep throwing up (vomiting). °· Your pain is felt only in portions of the abdomen, such as the right side or the left lower portion of the abdomen. °· You pass bloody or black tarry stools. °MAKE SURE YOU: °· Understand these instructions. °· Will watch your condition. °· Will get help right away if you are not doing well or get worse. °  °This information is not intended to replace advice given to you by your health care provider. Make sure you discuss  any questions you have with your health care provider. °  °Document Released: 07/27/2005 Document Revised: 07/08/2015 Document Reviewed: 06/26/2013 °Elsevier Interactive Patient Education ©2016 [employer] ° °Back Pain, Adult °Back pain is very common in adults. The cause of back pain is rarely dangerous and the pain often gets better over time. The cause of your back pain may not be known. Some common causes of back pain include: °· Strain of the muscles or ligaments supporting the spine. °· Wear and tear (degeneration) of the spinal disks. °· Arthritis. °· Direct injury to the back. °For many people, back pain may return. Since back pain is rarely dangerous, most people can learn to manage this condition on their own. °HOME CARE INSTRUCTIONS °Watch your back pain for any changes. The following actions may help to lessen any discomfort you are feeling: °· Remain active. It is stressful on your back to sit or stand in one place for long periods of time. Do not sit, drive, or stand in one place for more than 30 minutes at a time. Take short walks on even surfaces as soon as you are able. Try to increase the length of time you walk each day. °· Exercise regularly as directed by your health care provider. Exercise helps your back heal faster. It also helps avoid future injury by keeping your muscles strong and flexible. °· Do not stay in bed. Resting more than 1-2 days can delay your recovery. °· Pay attention to your body when you bend and lift.   The most comfortable positions are those that put less stress on your recovering back. Always use proper lifting techniques, including: °¨ Bending your knees. °¨ Keeping the load close to your body. °¨ Avoiding twisting. °· Find a comfortable position to sleep. Use a firm mattress and lie on your side with your knees slightly bent. If you lie on your back, put a pillow under your knees. °· Avoid feeling anxious or stressed. Stress increases muscle tension and can worsen back  pain. It is important to recognize when you are anxious or stressed and learn ways to manage it, such as with exercise. °· Take medicines only as directed by your health care provider. Over-the-counter medicines to reduce pain and inflammation are often the most helpful. Your health care provider may prescribe muscle relaxant drugs. These medicines help dull your pain so you can more quickly return to your normal activities and healthy exercise. °· Apply ice to the injured area: °¨ Put ice in a plastic bag. °¨ Place a towel between your skin and the bag. °¨ Leave the ice on for 20 minutes, 2-3 times a day for the first 2-3 days. After that, ice and heat may be alternated to reduce pain and spasms. °· Maintain a healthy weight. Excess weight puts extra stress on your back and makes it difficult to maintain good posture. °SEEK MEDICAL CARE IF: °· You have pain that is not relieved with rest or medicine. °· You have increasing pain going down into the legs or buttocks. °· You have pain that does not improve in one week. °· You have night pain. °· You lose weight. °· You have a fever or chills. °SEEK IMMEDIATE MEDICAL CARE IF:  °· You develop new bowel or bladder control problems. °· You have unusual weakness or numbness in your arms or legs. °· You develop nausea or vomiting. °· You develop abdominal pain. °· You feel faint. °  °This information is not intended to replace advice given to you by your health care provider. Make sure you discuss any questions you have with your health care provider. °  °Document Released: 10/17/2005 Document Revised: 11/07/2014 Document Reviewed: 02/18/2014 °Elsevier Interactive Patient Education ©2016 [employer] ° °

## 2016-04-22 NOTE — ED Provider Notes (Signed)
CSN: [id number]     Arrival date & time 04/22/16  1341 History   First MD Initiated Contact with Patient 04/22/16 1459     Chief Complaint  Patient presents with  . Abdominal Pain     (Consider location/radiation/quality/duration/timing/severity/associated sxs/prior Treatment) HPI   Bartholomew CrewsJason A Barca is a 30 y.o. male who presents for evaluation of right upper quadrant abdominal pain for 3 days, associated with vomiting and diarrhea. No blood in either emesis or stool. No fever, chills, cough, chest pain, weakness or dizziness. He states that the abdominal pain has caused his chronic low back pain to recur. Also, yesterday, he was stung several times in the back, by "Bees". He denies shortness of breath. There are no other known modifying factors.   Past Medical History  Diagnosis Date  . Back injury   . Traumatic brain injury [facility](HCC)    History reviewed. No pertinent past surgical history. Family History  Problem Relation Age of Onset  . Heart attack Father   . Congenital heart disease Sister    Social History  Substance Use Topics  . Smoking status: Current Some Day Smoker -- 10 years    Types: Cigarettes  . Smokeless tobacco: Never Used  . Alcohol Use: No    Review of Systems  All other systems reviewed and are negative.     Allergies  Review of patient's allergies indicates no known allergies.  Home Medications   Prior to Admission medications   Medication Sig Start Date End Date Taking? Authorizing Provider  amitriptyline (ELAVIL) 10 MG tablet Take 10 mg by mouth at bedtime.   Yes Historical Provider, MD  baclofen (LIORESAL) 10 MG tablet Take 10 mg by mouth 3 (three) times daily.   Yes Historical Provider, MD  diclofenac (VOLTAREN) 50 MG EC tablet Take 50 mg by mouth 2 (two) times daily.   Yes Historical Provider, MD  ibuprofen (ADVIL,MOTRIN) 200 MG tablet Take 200-800 mg by mouth every 6 (six) hours as needed for headache or moderate pain.   Yes Historical Provider,  MD  losartan (COZAAR) 25 MG tablet Take 25 mg by mouth 2 (two) times daily.   Yes Historical Provider, MD  Melatonin 5 MG TABS Take 15 mg by mouth at bedtime as needed (sleep).   Yes Historical Provider, MD  oxyCODONE-acetaminophen (PERCOCET) 5-325 MG tablet Take 1 tablet by mouth every 4 (four) hours as needed for severe pain. 04/22/16   Mancel BaleElliott Haik Mahoney, MD   BP 142/106 mmHg  Pulse 92  Temp(Src) 98.4 F (36.9 C) (Oral)  Resp 18  SpO2 99% Physical Exam  Constitutional: He is oriented to person, place, and time. He appears well-developed and well-nourished. No distress.  HENT:  Head: Normocephalic and atraumatic.  Right Ear: External ear normal.  Left Ear: External ear normal.  Eyes: Conjunctivae and EOM are normal. Pupils are equal, round, and reactive to light.  Neck: Normal range of motion and phonation normal. Neck supple.  Cardiovascular: Normal rate, regular rhythm and normal heart sounds.   Pulmonary/Chest: Effort normal and breath sounds normal. He exhibits no bony tenderness.  Abdominal: Soft. He exhibits no mass. There is tenderness (Moderate right upper quadrant tenderness). There is guarding (Right upper quadrant). There is no rebound.  Musculoskeletal: Normal range of motion. He exhibits no edema.  Tender lumbar diffusely. Resists motion secondary to low back pain.  Neurological: He is alert and oriented to person, place, and time. No cranial nerve deficit or sensory deficit. He exhibits normal muscle  tone. Coordination normal.  Skin: Skin is warm, dry and intact.  Psychiatric: He has a normal mood and affect. His behavior is normal. Judgment and thought content normal.  Nursing note and vitals reviewed.   ED Course  Procedures (including critical care time)  Medications  0.9 %  sodium chloride infusion (not administered)  ondansetron (ZOFRAN) injection 4 mg (4 mg Intravenous Given 04/22/16 1531)  sodium chloride 0.9 % bolus 1,000 mL (1,000 mLs Intravenous New Bag/Given  04/22/16 1535)  HYDROmorphone (DILAUDID) injection 1 mg (1 mg Intravenous Given 04/22/16 1531)    Patient Vitals for the past 24 hrs:  BP Temp Temp src Pulse Resp SpO2  04/22/16 1401 (!) 142/106 mmHg 98.4 F (36.9 C) Oral 92 18 99 %    3:24 PM Reevaluation with update and discussion. After initial assessment and treatment, an updated evaluation reveals He is more comfortable now, stating abdominal pain has resolved and he has only low back pain. Findings discussed with patient and significant other, all questions answered. Nat Lowenthal L    Labs Review Labs Reviewed  COMPREHENSIVE METABOLIC PANEL - Abnormal; Notable for the following:    Glucose, Bld 103 (*)    AST 50 (*)    ALT 71 (*)    All other components within normal limits  CBC - Abnormal; Notable for the following:    WBC 11.1 (*)    All other components within normal limits  LIPASE, BLOOD  URINALYSIS, ROUTINE W REFLEX MICROSCOPIC (NOT AT [facility])    Imaging Review [place] Abdomen Complete  04/22/2016  CLINICAL DATA:  Five day history of right upper quadrant pain. EXAM: ABDOMEN ULTRASOUND COMPLETE COMPARISON:  None. FINDINGS: Gallbladder: No gallstones or gallbladder wall thickening. No pericholecystic fluid. The sonographer reports no sonographic Murphy's sign. Common bile duct: Diameter: 1-2 mm. Liver: No focal lesion identified. Within normal limits in parenchymal echogenicity. IVC: No abnormality visualized. Pancreas: Visualized portion unremarkable. Spleen: Size and appearance within normal limits. Right Kidney: Length: 10.8 cm. Echogenicity within normal limits. No mass or hydronephrosis visualized. Left Kidney: Length: 11.2 cm. Echogenicity within normal limits. No mass or hydronephrosis visualized. Abdominal aorta: No aneurysm visualized. Other findings: None. IMPRESSION: Normal abdominal ultrasound. Electronically Signed   By: Kennith CenterEric  Mansell M.D.   On: 04/22/2016 16:22   I have personally reviewed and evaluated these images and  lab results as part of my medical decision-making.   EKG Interpretation None      MDM   Final diagnoses:  Right upper quadrant pain  Low back pain without sciatica, unspecified back pain laterality    Nonspecific abdominal pain with vomiting, and secondary back pain. No clear etiology for vomiting. No evidence for gallbladder disease, metabolic instability or serious bacterial infection.  Nursing Notes Reviewed/ Care Coordinated Applicable Imaging Reviewed Interpretation of Laboratory Data incorporated into ED treatment  The patient appears reasonably screened and/or stabilized for discharge and I doubt any other medical condition or other [facility] requiring further screening, evaluation, or treatment in the ED at this time prior to discharge.  Plan: Home Medications- Percocet; Home Treatments- rest; return here if the recommended treatment, does not improve the symptoms; Recommended follow up- PCP prn     Mancel BaleElliott Welda Azzarello, MD 04/22/16 1710

## 2016-04-22 NOTE — ED Notes (Signed)
Pt reports RUQ pain with n/v x 3 days.  Pain is worse after eating.  Pt reports he thinks it could be his gall bladder.  Pt also reports constant nausea.

## 2016-04-22 NOTE — ED Notes (Signed)
RUQ abd pain, sharp, starting a couple days ago, hurts to breathe, causing chronic back pain to flare up. Believes it may be his gallbladder, family hx of gallbladder problems. C/o N/V/D, no fever in triage, denies urinary complaints

## 2017-04-16 IMAGING — CT CT CERVICAL SPINE W/O CM
5 of 6 series · 14 of 33 positions shown, 16 images · non-contrast
Comparison: None.

CLINICAL DATA: Fall landing on the neck.  Initial encounter.

EXAM:
CT HEAD WITHOUT CONTRAST
CT CERVICAL SPINE WITHOUT CONTRAST
TECHNIQUE: Multidetector CT imaging of the head and cervical spine was
performed following the standard protocol without intravenous
contrast. Multiplanar CT image reconstructions of the cervical spine
were also generated.

[Series 3: headseq 2.4 h60s · axial · 0.49mm/px · z∈[+305,+365]mm · 2 of 72 slices shown, 3 images]
[im 24/72  soft-tissue]
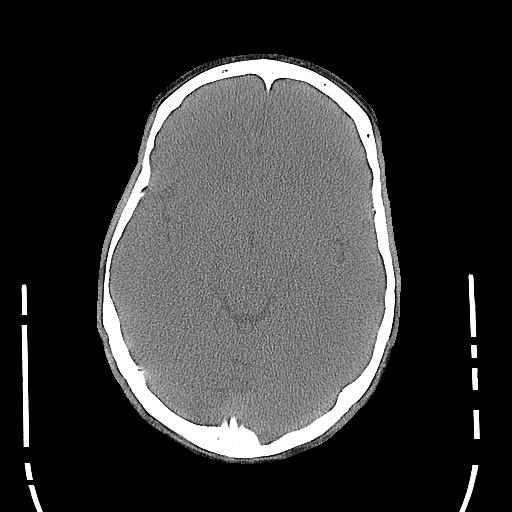
[im 24/72  bone]
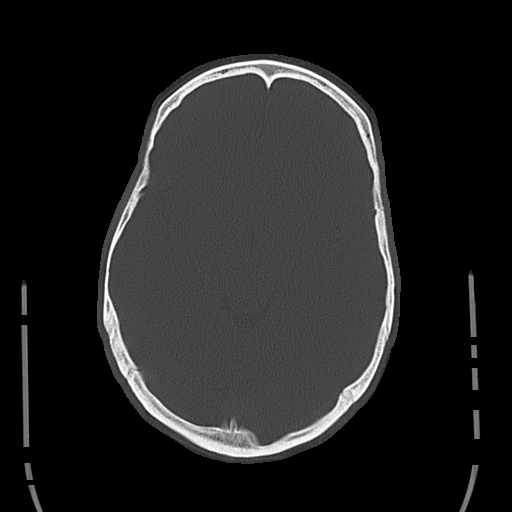
[im 48/72  bone]
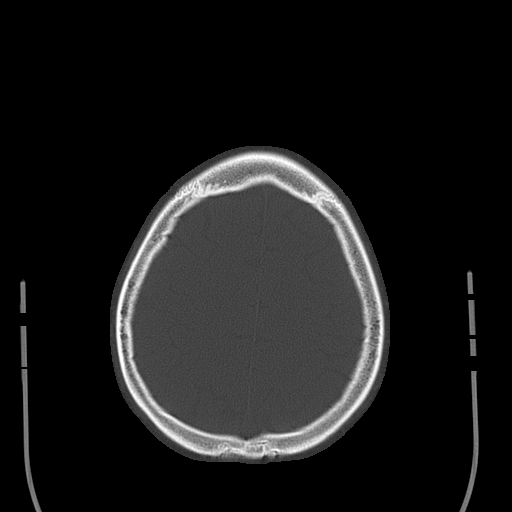

[Series 5: cervical st 2.0 b31s · axial · 0.35mm/px · z∈[+121,+173]mm · 2 of 79 slices shown]
[im 27/79  bone]
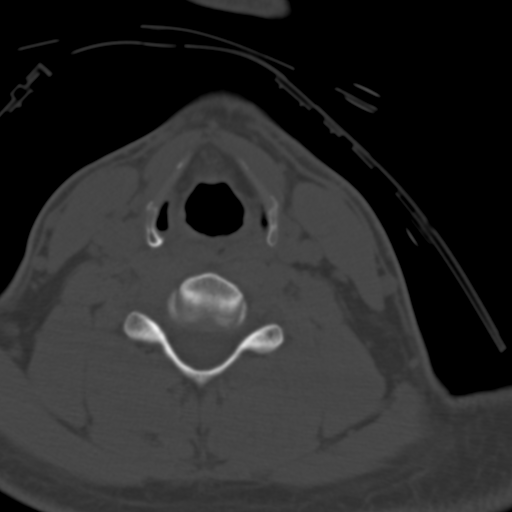
[im 53/79  bone]
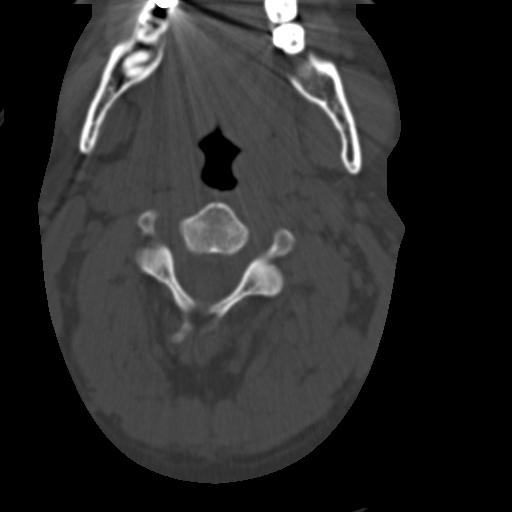

[Series 7: sagittal bone 2.0 · sagittal · 0.26mm/px · 5 of 60 slices shown, 6 images]
[im 20/60  bone]
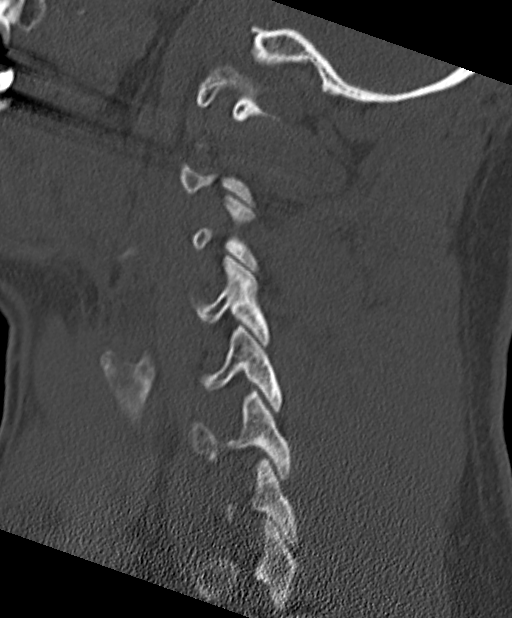
[im 25/60  bone]
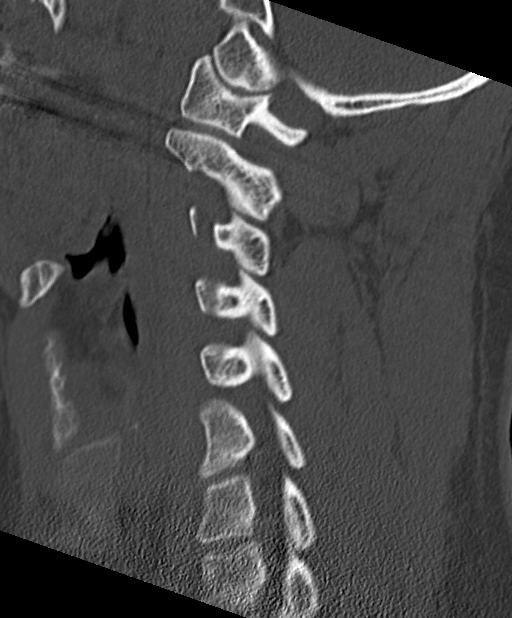
[im 30/60  soft-tissue]
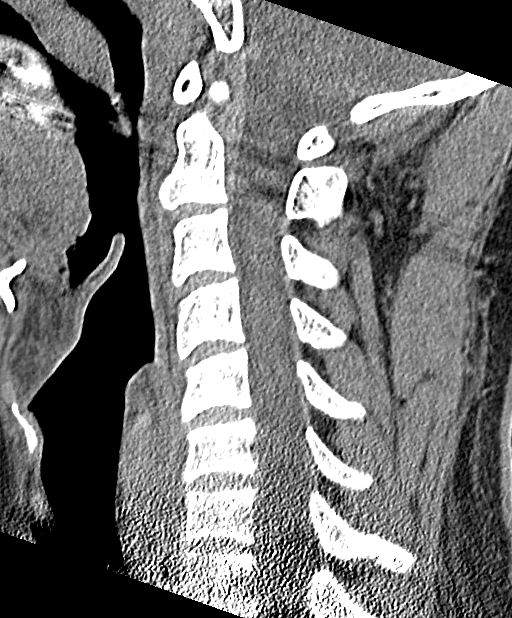
[im 30/60  bone]
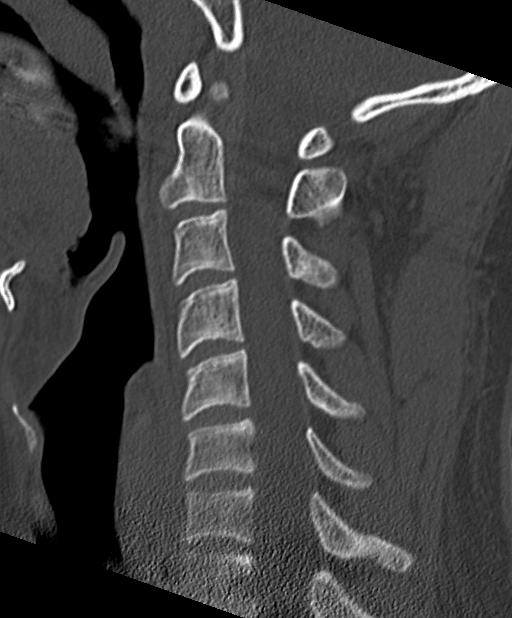
[im 35/60  bone]
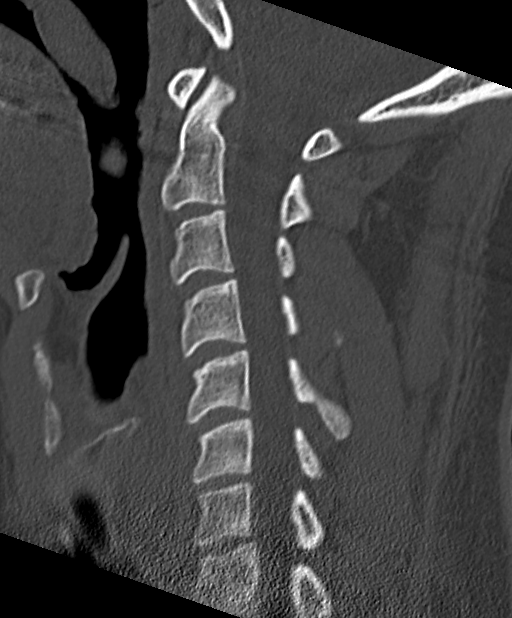
[im 40/60  bone]
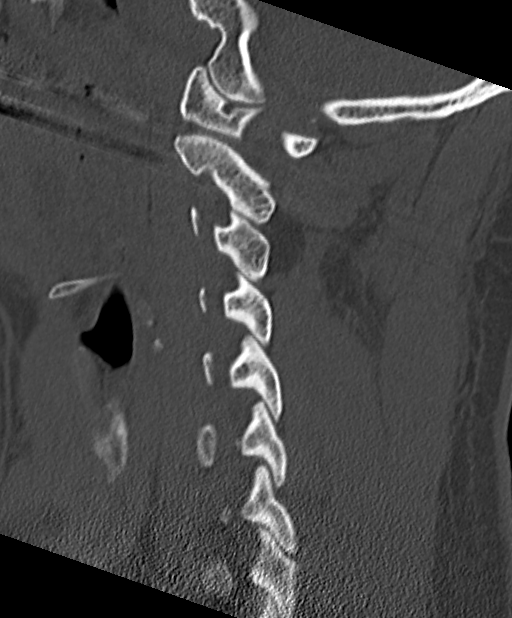

[Series 8: coronal bone 2.0 · coronal · 0.32mm/px · 3 of 59 slices shown]
[im 12/59  bone]
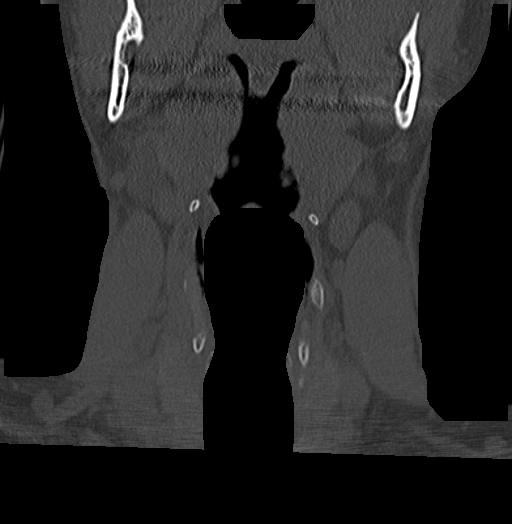
[im 24/59  bone]
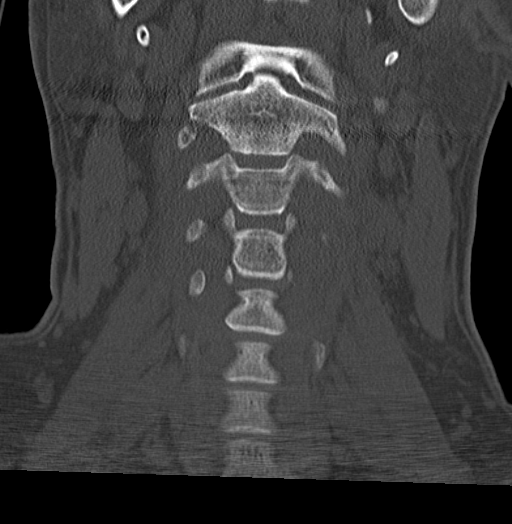
[im 35/59  bone]
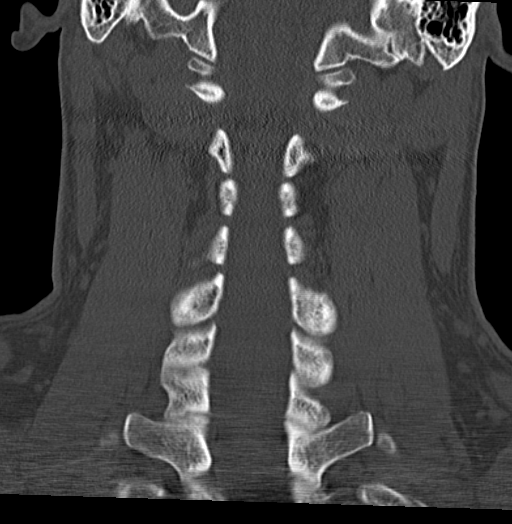

[Series 9: axial bone 2.0 · axial · 0.21mm/px · z∈[+101,+152]mm · 2 of 83 slices shown]
[im 28/83  bone]
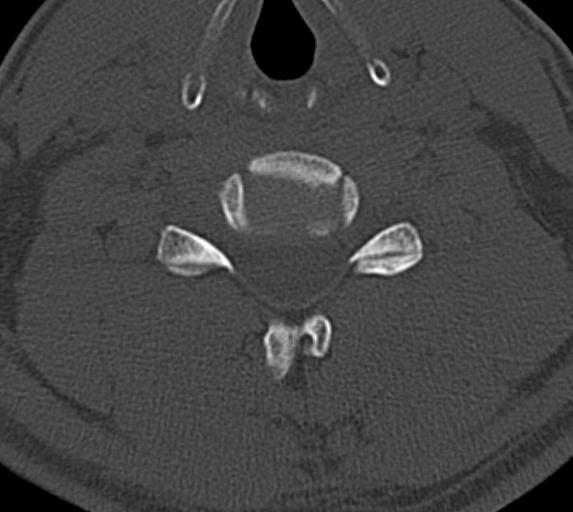
[im 55/83  bone]
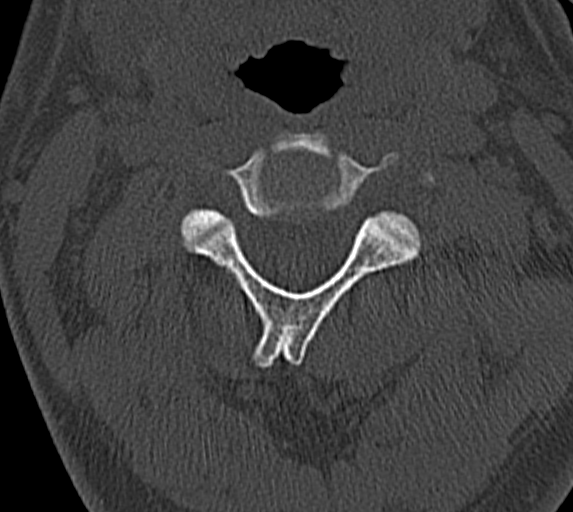

[14 of 33 positions shown; findings below may reference images not displayed]

FINDINGS: CT HEAD FINDINGS

Skull and Sinuses:There is a small lipoma in the left occipital
scalp.

No fracture.

The posterior nasal cavity is narrowed by a large anteriorly
extending right sphenoid sinus.

Orbits: No acute abnormality.

Brain: No evidence of acute infarction, hemorrhage, hydrocephalus,
or mass lesion/mass effect.

CT CERVICAL SPINE FINDINGS

Negative for acute fracture or subluxation. No prevertebral edema.
No gross cervical canal hematoma. No significant osseous canal or
foraminal stenosis.
IMPRESSION: No evidence of intracranial or cervical spine injury.
